# Patient Record
Sex: Male | Born: 1960 | Race: White | Hispanic: No | Marital: Married | Smoking: Never smoker
Health system: Southern US, Community
[De-identification: ages and names within clinical notes are randomized; demographics above are authoritative.]

## PROBLEM LIST (undated history)

## (undated) DIAGNOSIS — M67912 Unspecified disorder of synovium and tendon, left shoulder: Secondary | ICD-10-CM

## (undated) HISTORY — PX: TONSILLECTOMY: SUR1361

---

## 2017-02-12 ENCOUNTER — Encounter (HOSPITAL_COMMUNITY): Payer: Self-pay | Admitting: *Deleted

## 2017-02-12 ENCOUNTER — Emergency Department (HOSPITAL_COMMUNITY): Payer: Self-pay

## 2017-02-12 ENCOUNTER — Emergency Department (HOSPITAL_COMMUNITY)
Admission: EM | Admit: 2017-02-12 | Discharge: 2017-02-12 | Disposition: A | Discharge status: Home / Self Care | Payer: Self-pay | Attending: Emergency Medicine | Admitting: Emergency Medicine

## 2017-02-12 ENCOUNTER — Other Ambulatory Visit: Payer: Self-pay

## 2017-02-12 DIAGNOSIS — Y999 Unspecified external cause status: Secondary | ICD-10-CM | POA: Insufficient documentation

## 2017-02-12 DIAGNOSIS — Y9389 Activity, other specified: Secondary | ICD-10-CM | POA: Insufficient documentation

## 2017-02-12 DIAGNOSIS — S43015A Anterior dislocation of left humerus, initial encounter: Secondary | ICD-10-CM | POA: Insufficient documentation

## 2017-02-12 DIAGNOSIS — Y929 Unspecified place or not applicable: Secondary | ICD-10-CM | POA: Insufficient documentation

## 2017-02-12 DIAGNOSIS — M25562 Pain in left knee: Secondary | ICD-10-CM | POA: Insufficient documentation

## 2017-02-12 MED ORDER — KETAMINE HCL 10 MG/ML IJ SOLN
INTRAMUSCULAR | Status: DC | PRN
  Administered 2017-02-12: 60 mg via INTRAVENOUS

## 2017-02-12 MED ORDER — MORPHINE SULFATE (PF) 4 MG/ML IV SOLN
4.0000 mg | Freq: Once | INTRAVENOUS | Status: AC
  Administered 2017-02-12: 4 mg via INTRAVENOUS
  Filled 2017-02-12: qty 1

## 2017-02-12 MED ORDER — PROPOFOL 10 MG/ML IV BOLUS
40.0000 mg | Freq: Once | INTRAVENOUS | Status: DC
  Filled 2017-02-12: qty 20

## 2017-02-12 MED ORDER — PROPOFOL 10 MG/ML IV BOLUS
INTRAVENOUS | Status: DC | PRN
  Administered 2017-02-12: 40 mg via INTRAVENOUS

## 2017-02-12 MED ORDER — KETAMINE HCL-SODIUM CHLORIDE 100-0.9 MG/10ML-% IV SOSY
PREFILLED_SYRINGE | INTRAVENOUS | Status: AC
  Filled 2017-02-12: qty 10

## 2017-02-12 MED ORDER — ONDANSETRON 4 MG PO TBDP: 4.0000 mg | ORAL_TABLET | Freq: Three times a day (TID) | ORAL | 0 refills | Status: DC | PRN

## 2017-02-12 MED ORDER — OXYCODONE-ACETAMINOPHEN 5-325 MG PO TABS: 1.0000 | ORAL_TABLET | ORAL | 0 refills | Status: DC | PRN

## 2017-02-12 MED ORDER — KETAMINE HCL-SODIUM CHLORIDE 100-0.9 MG/10ML-% IV SOSY: 60.0000 mg | PREFILLED_SYRINGE | Freq: Once | INTRAVENOUS | Status: DC
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
## 2017-02-12 NOTE — ED Provider Notes (Signed)
Medical screening examination/treatment/procedure(s) were conducted as a shared visit with non-physician practitioner(s) and myself.  I personally evaluated the patient during the encounter.   EKG Interpretation None      56yM with L anterior shoulder dislocation. Reduced by Sharilyn SitesLisa Sanders. Please see her note for further details.  I was present for reduction and preformed sedation.    .Sedation Date/Time: 02/12/2017 7:15 AM Performed by: Raeford RazorKohut, Cindee Mclester, MD Authorized by: Raeford RazorKohut, Fiona Coto, MD   Consent:    Consent obtained:  Verbal   Consent given by:  Patient   Risks discussed:  Allergic reaction, dysrhythmia, inadequate sedation, nausea, prolonged hypoxia resulting in organ damage, prolonged sedation necessitating reversal, respiratory compromise necessitating ventilatory assistance and intubation and vomiting   Alternatives discussed:  Analgesia without sedation, anxiolysis and regional anesthesia Universal protocol:    Procedure explained and questions answered to patient or proxy's satisfaction: yes     Relevant documents present and verified: yes     Test results available and properly labeled: yes     Imaging studies available: yes     Required blood products, implants, devices, and special equipment available: yes     Site/side marked: yes     Immediately prior to procedure a time out was called: yes     Patient identity confirmation method:  Verbally with patient Indications:    Procedure performed:  Dislocation reduction   Procedure necessitating sedation performed by:  Physician performing sedation   Intended level of sedation:  Deep Pre-sedation assessment:    Time since last food or drink:  .   NPO status caution: urgency dictates proceeding with non-ideal NPO status     ASA classification: class 2 - patient with mild systemic disease     Neck mobility: normal     Mouth opening:  2 finger widths   Thyromental distance:  3 finger widths   Mallampati score:  II - soft  palate, uvula, fauces visible   Pre-sedation assessments completed and reviewed: airway patency, cardiovascular function, hydration status, mental status, nausea/vomiting, pain level, respiratory function and temperature     Pre-sedation assessment completed:  02/12/2017 7:10 AM Immediate pre-procedure details:    Reassessment: Patient reassessed immediately prior to procedure     Reviewed: vital signs, relevant labs/tests and NPO status     Verified: bag valve mask available, emergency equipment available, intubation equipment available, IV patency confirmed, oxygen available and suction available   Procedure details (see MAR for exact dosages):    Preoxygenation:  Nasal cannula   Sedation:  Propofol and ketamine   Intra-procedure monitoring:  Blood pressure monitoring, cardiac monitor, continuous pulse oximetry, frequent LOC assessments, frequent vital sign checks and continuous capnometry   Intra-procedure events: none     Total Provider sedation time (minutes):  35 Post-procedure details:    Post-sedation assessment completed:  02/12/2017 7:54 AM   Attendance: Constant attendance by certified staff until patient recovered     Recovery: Patient returned to pre-procedure baseline     Post-sedation assessments completed and reviewed: airway patency, cardiovascular function, hydration status, mental status, nausea/vomiting, pain level and respiratory function     Patient is stable for discharge or admission: yes     Patient tolerance:  Tolerated well, no immediate complications     Raeford RazorKohut, Dacen Frayre, MD 02/12/17 [phone]

## 2017-02-12 NOTE — ED Notes (Signed)
Correction lt knee pain

## 2017-02-12 NOTE — ED Notes (Signed)
Portable XR at bedside

## 2017-02-12 NOTE — ED Notes (Signed)
Set up for conscious sedation ready at bedside, awaiting MD to be available for sedation.
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
## 2017-02-12 NOTE — Discharge Instructions (Signed)
Take the prescribed medication as directed.  Can use motrin/ibuprofen in between doses if needed. Keep arm in sling for now, do not lift arm above your head. Follow-up with orthopedics-- call Monday morning to schedule appt. Return to the ED for new or worsening symptoms.

## 2017-03-11 ENCOUNTER — Other Ambulatory Visit: Payer: Self-pay | Admitting: Orthopaedic Surgery

## 2017-03-11 DIAGNOSIS — M25512 Pain in left shoulder: Secondary | ICD-10-CM

## 2017-03-19 ENCOUNTER — Ambulatory Visit
Admission: RE | Admit: 2017-03-19 | Discharge: 2017-03-19 | Disposition: A | Discharge status: Home / Self Care | Payer: Self-pay | Source: Ambulatory Visit | Attending: Orthopaedic Surgery | Admitting: Orthopaedic Surgery

## 2017-03-19 DIAGNOSIS — M25512 Pain in left shoulder: Secondary | ICD-10-CM

## 2017-04-08 ENCOUNTER — Other Ambulatory Visit: Payer: Self-pay

## 2017-04-08 ENCOUNTER — Encounter (HOSPITAL_BASED_OUTPATIENT_CLINIC_OR_DEPARTMENT_OTHER): Payer: Self-pay | Admitting: *Deleted

## 2017-04-10 NOTE — H&P (Signed)
PREOPERATIVE H&P  Chief Complaint: LEFT SHOULDER CARTILEDGE DISORDER ROTATOR CUFF TEAR BURSITIS AND TENDONITIS  HPI: Alex Jones is a 57 y.o. male who presents for preoperative history and physical with a diagnosis of LEFT SHOULDER CARTILEDGE DISORDER ROTATOR CUFF TEAR BURSITIS AND TENDONITIS. Symptoms are rated as moderate to severe, and have been worsening.  This is significantly impairing activities of daily living.  He has elected for surgical management.   Past Medical History:  Diagnosis Date  . Rotator cuff disorder, left    Past Surgical History:  Procedure Laterality Date  . TONSILLECTOMY     at age 89   Social History   Socioeconomic History  . Marital status: Married    Spouse name: None  . Number of children: None  . Years of education: None  . Highest education level: None  Social Needs  . Financial resource strain: None  . Food insecurity - worry: None  . Food insecurity - inability: None  . Transportation needs - medical: None  . Transportation needs - non-medical: None  Occupational History  . None  Tobacco Use  . Smoking status: Never Smoker  . Smokeless tobacco: Current User    Types: Snuff  Substance and Sexual Activity  . Alcohol use: Yes    Comment: social  . Drug use: No  . Sexual activity: None  Other Topics Concern  . None  Social History Narrative  . None   History reviewed. No pertinent family history. Allergies  Allergen Reactions  . Oxycodone Nausea And Vomiting   Prior to Admission medications   Medication Sig Start Date End Date Taking? Authorizing Provider  oxyCODONE-acetaminophen (PERCOCET) 5-325 MG tablet Take 1 tablet by mouth every 4 (four) hours as needed. 02/12/17  Yes Garlon HatchetSanders, Lisa M, PA-C     Positive ROS: All other systems have been reviewed and were otherwise negative with the exception of those mentioned in the HPI and as above.  Physical Exam: General: Alert, no acute distress Cardiovascular: No pedal  edema Respiratory: No cyanosis, no use of accessory musculature GI: No organomegaly, abdomen is soft and non-tender Skin: No lesions in the area of chief complaint Neurologic: Sensation intact distally Psychiatric: Patient is competent for consent with normal mood and affect Lymphatic: No axillary or cervical lymphadenopathy  MUSCULOSKELETAL: L shoulder pain with ROM, 4/5 cuff, NVID  Assessment: LEFT SHOULDER CARTILEDGE DISORDER ROTATOR CUFF TEAR BURSITIS AND TENDONITIS  Plan: Plan for Procedure(s): LEFT SHOULDER ARTHROSCOPY WITH SUBACROMIAL DECOMPRESSION AND ROTATOR CUFF  BICEP TENDON REPAIR  The risks benefits and alternatives were discussed with the patient including but not limited to the risks of nonoperative treatment, versus surgical intervention including infection, bleeding, nerve injury,  blood clots, cardiopulmonary complications, morbidity, mortality, among others, and they were willing to proceed.   Bjorn Pippinax T Nohea Kras, MD  04/10/2017 7:57 PM

## 2017-04-11 ENCOUNTER — Ambulatory Visit (HOSPITAL_BASED_OUTPATIENT_CLINIC_OR_DEPARTMENT_OTHER): Payer: Worker's Compensation | Admitting: Anesthesiology

## 2017-04-11 ENCOUNTER — Encounter (HOSPITAL_BASED_OUTPATIENT_CLINIC_OR_DEPARTMENT_OTHER): Payer: Self-pay | Admitting: *Deleted

## 2017-04-11 ENCOUNTER — Ambulatory Visit (HOSPITAL_BASED_OUTPATIENT_CLINIC_OR_DEPARTMENT_OTHER)
Admission: RE | Admit: 2017-04-11 | Discharge: 2017-04-11 | Disposition: A | Discharge status: Home / Self Care | Payer: Worker's Compensation | Source: Ambulatory Visit | Attending: Orthopaedic Surgery | Admitting: Orthopaedic Surgery

## 2017-04-11 ENCOUNTER — Encounter (HOSPITAL_BASED_OUTPATIENT_CLINIC_OR_DEPARTMENT_OTHER)
Admission: RE | Disposition: A | Discharge status: Home / Self Care | Payer: Self-pay | Source: Ambulatory Visit | Attending: Orthopaedic Surgery

## 2017-04-11 ENCOUNTER — Other Ambulatory Visit: Payer: Self-pay

## 2017-04-11 DIAGNOSIS — M24012 Loose body in left shoulder: Secondary | ICD-10-CM | POA: Diagnosis not present

## 2017-04-11 DIAGNOSIS — M7552 Bursitis of left shoulder: Secondary | ICD-10-CM | POA: Insufficient documentation

## 2017-04-11 DIAGNOSIS — X58XXXA Exposure to other specified factors, initial encounter: Secondary | ICD-10-CM | POA: Diagnosis not present

## 2017-04-11 DIAGNOSIS — Z885 Allergy status to narcotic agent status: Secondary | ICD-10-CM | POA: Diagnosis not present

## 2017-04-11 DIAGNOSIS — S43432A Superior glenoid labrum lesion of left shoulder, initial encounter: Secondary | ICD-10-CM | POA: Insufficient documentation

## 2017-04-11 DIAGNOSIS — F17220 Nicotine dependence, chewing tobacco, uncomplicated: Secondary | ICD-10-CM | POA: Diagnosis not present

## 2017-04-11 DIAGNOSIS — M75102 Unspecified rotator cuff tear or rupture of left shoulder, not specified as traumatic: Secondary | ICD-10-CM | POA: Diagnosis not present

## 2017-04-11 HISTORY — PX: SHOULDER ARTHROSCOPY WITH ROTATOR CUFF REPAIR AND SUBACROMIAL DECOMPRESSION: SHX5686

## 2017-04-11 HISTORY — PX: SHOULDER ARTHROSCOPY WITH BICEPS TENDON REPAIR: SHX5674

## 2017-04-11 HISTORY — DX: Unspecified disorder of synovium and tendon, left shoulder: M67.912

## 2017-04-11 SURGERY — SHOULDER ARTHROSCOPY WITH ROTATOR CUFF REPAIR AND SUBACROMIAL DECOMPRESSION
Anesthesia: General | Site: Shoulder | Laterality: Left

## 2017-04-11 MED ORDER — ONDANSETRON HCL 4 MG/2ML IJ SOLN
INTRAMUSCULAR | Status: AC
  Filled 2017-04-11: qty 2

## 2017-04-11 MED ORDER — MIDAZOLAM HCL 2 MG/2ML IJ SOLN
1.0000 mg | INTRAMUSCULAR | Status: DC | PRN
  Administered 2017-04-11: 2 mg via INTRAVENOUS

## 2017-04-11 MED ORDER — DEXTROSE 5 % IV SOLN
3.0000 g | INTRAVENOUS | Status: AC
  Administered 2017-04-11: 3 g via INTRAVENOUS

## 2017-04-11 MED ORDER — FENTANYL CITRATE (PF) 100 MCG/2ML IJ SOLN
50.0000 ug | INTRAMUSCULAR | Status: DC | PRN
  Administered 2017-04-11: 100 ug via INTRAVENOUS
  Administered 2017-04-11: 25 ug via INTRAVENOUS

## 2017-04-11 MED ORDER — DEXAMETHASONE SODIUM PHOSPHATE 4 MG/ML IJ SOLN
INTRAMUSCULAR | Status: DC | PRN
  Administered 2017-04-11: 10 mg via INTRAVENOUS

## 2017-04-11 MED ORDER — SCOPOLAMINE 1 MG/3DAYS TD PT72: 1.0000 | MEDICATED_PATCH | Freq: Once | TRANSDERMAL | Status: DC | PRN

## 2017-04-11 MED ORDER — ONDANSETRON HCL 4 MG PO TABS: 4.0000 mg | ORAL_TABLET | Freq: Three times a day (TID) | ORAL | 1 refills | Status: AC | PRN

## 2017-04-11 MED ORDER — OXYCODONE HCL 5 MG PO TABS: ORAL_TABLET | ORAL | 0 refills | Status: AC

## 2017-04-11 MED ORDER — LIDOCAINE 2% (20 MG/ML) 5 ML SYRINGE
INTRAMUSCULAR | Status: DC | PRN
  Administered 2017-04-11: 100 mg via INTRAVENOUS

## 2017-04-11 MED ORDER — LACTATED RINGERS IV SOLN
INTRAVENOUS | Status: DC
  Administered 2017-04-11: 08:00:00 via INTRAVENOUS

## 2017-04-11 MED ORDER — ROPIVACAINE HCL 7.5 MG/ML IJ SOLN
INTRAMUSCULAR | Status: DC | PRN
  Administered 2017-04-11: 30 mL via PERINEURAL

## 2017-04-11 MED ORDER — PROPOFOL 10 MG/ML IV BOLUS
INTRAVENOUS | Status: AC
  Filled 2017-04-11: qty 20

## 2017-04-11 MED ORDER — FENTANYL CITRATE (PF) 100 MCG/2ML IJ SOLN
INTRAMUSCULAR | Status: AC
  Filled 2017-04-11: qty 2

## 2017-04-11 MED ORDER — EPINEPHRINE 30 MG/30ML IJ SOLN
INTRAMUSCULAR | Status: AC
  Filled 2017-04-11: qty 1

## 2017-04-11 MED ORDER — CEFAZOLIN SODIUM-DEXTROSE 2-4 GM/100ML-% IV SOLN
INTRAVENOUS | Status: AC
  Filled 2017-04-11: qty 200

## 2017-04-11 MED ORDER — PROPOFOL 10 MG/ML IV BOLUS
INTRAVENOUS | Status: DC | PRN
  Administered 2017-04-11: 50 mg via INTRAVENOUS
  Administered 2017-04-11: 200 mg via INTRAVENOUS

## 2017-04-11 MED ORDER — SODIUM CHLORIDE 0.9 % IR SOLN
Status: DC | PRN
  Administered 2017-04-11: 6000 mL

## 2017-04-11 MED ORDER — DEXAMETHASONE SODIUM PHOSPHATE 10 MG/ML IJ SOLN
INTRAMUSCULAR | Status: AC
  Filled 2017-04-11: qty 1

## 2017-04-11 MED ORDER — MEPERIDINE HCL 25 MG/ML IJ SOLN: 6.2500 mg | INTRAMUSCULAR | Status: DC | PRN

## 2017-04-11 MED ORDER — LIDOCAINE HCL (CARDIAC) 20 MG/ML IV SOLN
INTRAVENOUS | Status: AC
  Filled 2017-04-11: qty 5

## 2017-04-11 MED ORDER — ACETAMINOPHEN 500 MG PO TABS: 1000.0000 mg | ORAL_TABLET | Freq: Three times a day (TID) | ORAL | 0 refills | Status: AC

## 2017-04-11 MED ORDER — HYDROMORPHONE HCL 1 MG/ML IJ SOLN: 0.2500 mg | INTRAMUSCULAR | Status: DC | PRN

## 2017-04-11 MED ORDER — MELOXICAM 7.5 MG PO TABS: 7.5000 mg | ORAL_TABLET | Freq: Every day | ORAL | 2 refills | Status: AC

## 2017-04-11 MED ORDER — CLONIDINE HCL (ANALGESIA) 100 MCG/ML EP SOLN
EPIDURAL | Status: DC | PRN
  Administered 2017-04-11: 75 ug

## 2017-04-11 MED ORDER — MIDAZOLAM HCL 2 MG/2ML IJ SOLN
INTRAMUSCULAR | Status: AC
  Filled 2017-04-11: qty 2

## 2017-04-11 MED ORDER — SUCCINYLCHOLINE CHLORIDE 20 MG/ML IJ SOLN
INTRAMUSCULAR | Status: DC | PRN
  Administered 2017-04-11: 60 mg via INTRAVENOUS

## 2017-04-11 MED ORDER — PROMETHAZINE HCL 25 MG/ML IJ SOLN: 6.2500 mg | INTRAMUSCULAR | Status: DC | PRN

## 2017-04-11 MED ORDER — ONDANSETRON HCL 4 MG/2ML IJ SOLN
INTRAMUSCULAR | Status: DC | PRN
  Administered 2017-04-11: 4 mg via INTRAVENOUS

## 2017-04-11 MED ORDER — CHLORHEXIDINE GLUCONATE 4 % EX LIQD: 60.0000 mL | Freq: Once | CUTANEOUS | Status: DC

## 2017-04-11 SURGICAL SUPPLY — 76 items
ANCHOR PEEK 4.75X19.1 SWLK C (Anchor) ×9 IMPLANT
ANCHOR SUT FBRTK 2 TIGTAIL (Anchor) ×6 IMPLANT
BENZOIN TINCTURE PRP APPL 2/3 (GAUZE/BANDAGES/DRESSINGS) ×3 IMPLANT
BLADE EXCALIBUR 4.0MM X 13CM (MISCELLANEOUS) ×1
BLADE EXCALIBUR 4.0X13 (MISCELLANEOUS) ×2 IMPLANT
BLADE SHAVER BONE 5.0MM X 13CM (MISCELLANEOUS)
BLADE SHAVER BONE 5.0X13 (MISCELLANEOUS) IMPLANT
BNDG COHESIVE 4X5 TAN STRL (GAUZE/BANDAGES/DRESSINGS) IMPLANT
BURR OVAL 8 FLU 4.0MM X 13CM (MISCELLANEOUS)
BURR OVAL 8 FLU 4.0X13 (MISCELLANEOUS) IMPLANT
CANNULA 5.75X71 LONG (CANNULA) IMPLANT
CANNULA PASSPORT BUTTON 10-40 (CANNULA) ×9 IMPLANT
CANNULA TWIST IN 8.25X7CM (CANNULA) IMPLANT
CHLORAPREP W/TINT 26ML (MISCELLANEOUS) ×3 IMPLANT
CLOSURE WOUND 1/2 X4 (GAUZE/BANDAGES/DRESSINGS) ×1
DECANTER SPIKE VIAL GLASS SM (MISCELLANEOUS) IMPLANT
DISSECTOR 3.5MM X 13CM CVD (MISCELLANEOUS) IMPLANT
DISSECTOR 4.0MMX13CM CVD (MISCELLANEOUS) IMPLANT
DRAPE IMP U-DRAPE 54X76 (DRAPES) ×3 IMPLANT
DRAPE INCISE IOBAN 66X45 STRL (DRAPES) ×3 IMPLANT
DRAPE STERI 35X30 U-POUCH (DRAPES) ×3 IMPLANT
DRAPE U-SHAPE 76X120 STRL (DRAPES) ×3 IMPLANT
DRSG PAD ABDOMINAL 8X10 ST (GAUZE/BANDAGES/DRESSINGS) ×3 IMPLANT
ELECT NEEDLE TIP 2.8 STRL (NEEDLE) IMPLANT
ELECT REM PT RETURN 9FT ADLT (ELECTROSURGICAL) ×3
ELECTRODE REM PT RTRN 9FT ADLT (ELECTROSURGICAL) ×1 IMPLANT
GAUZE SPONGE 4X4 12PLY STRL (GAUZE/BANDAGES/DRESSINGS) ×3 IMPLANT
GAUZE XEROFORM 1X8 LF (GAUZE/BANDAGES/DRESSINGS) IMPLANT
GLOVE BIO SURGEON STRL SZ8 (GLOVE) ×3 IMPLANT
GLOVE BIOGEL PI IND STRL 7.0 (GLOVE) ×1 IMPLANT
GLOVE BIOGEL PI IND STRL 8 (GLOVE) ×2 IMPLANT
GLOVE BIOGEL PI INDICATOR 7.0 (GLOVE) ×2
GLOVE BIOGEL PI INDICATOR 8 (GLOVE) ×4
GLOVE ECLIPSE 6.5 STRL STRAW (GLOVE) ×3 IMPLANT
GLOVE ECLIPSE 8.0 STRL XLNG CF (GLOVE) ×3 IMPLANT
GOWN STRL REUS W/ TWL LRG LVL3 (GOWN DISPOSABLE) ×1 IMPLANT
GOWN STRL REUS W/TWL LRG LVL3 (GOWN DISPOSABLE) ×2
GOWN STRL REUS W/TWL XL LVL3 (GOWN DISPOSABLE) ×3 IMPLANT
KIT SPEAR STR 1.6MM DRILL (MISCELLANEOUS) ×3 IMPLANT
KIT STABILIZATION SHOULDER (MISCELLANEOUS) ×3 IMPLANT
LASSO 90 CVE QUICKPAS (DISPOSABLE) IMPLANT
MANIFOLD NEPTUNE II (INSTRUMENTS) ×3 IMPLANT
NDL SUT 6 .5 CRC .975X.05 MAYO (NEEDLE) IMPLANT
NEEDLE MAYO TAPER (NEEDLE)
NEEDLE SCORPION MULTI FIRE (NEEDLE) ×6 IMPLANT
PACK ARTHROSCOPY DSU (CUSTOM PROCEDURE TRAY) ×3 IMPLANT
PACK BASIN DAY SURGERY FS (CUSTOM PROCEDURE TRAY) ×3 IMPLANT
PENCIL BUTTON HOLSTER BLD 10FT (ELECTRODE) IMPLANT
PROBE BIPOLAR ATHRO 135MM 90D (MISCELLANEOUS) ×3 IMPLANT
RESTRAINT HEAD UNIVERSAL NS (MISCELLANEOUS) ×3 IMPLANT
SHEET MEDIUM DRAPE 40X70 STRL (DRAPES) ×3 IMPLANT
SLEEVE SCD COMPRESS KNEE MED (MISCELLANEOUS) ×3 IMPLANT
SLING ARM FOAM STRAP LRG (SOFTGOODS) IMPLANT
SLING ARM IMMOBILIZER LRG (SOFTGOODS) IMPLANT
SLING ARM IMMOBILIZER MED (SOFTGOODS) IMPLANT
SLING ARM IMMOBILIZER XL (CAST SUPPLIES) ×3 IMPLANT
SLING ARM MED ADULT FOAM STRAP (SOFTGOODS) IMPLANT
SLING ARM XL FOAM STRAP (SOFTGOODS) IMPLANT
SPONGE LAP 4X18 X RAY DECT (DISPOSABLE) ×3 IMPLANT
STRIP CLOSURE SKIN 1/2X4 (GAUZE/BANDAGES/DRESSINGS) ×2 IMPLANT
SUCTION FRAZIER HANDLE 10FR (MISCELLANEOUS)
SUCTION TUBE FRAZIER 10FR DISP (MISCELLANEOUS) IMPLANT
SUT ETHILON 3 0 PS 1 (SUTURE) IMPLANT
SUT FIBERWIRE #2 38 T-5 BLUE (SUTURE)
SUT MNCRL AB 4-0 PS2 18 (SUTURE) ×3 IMPLANT
SUT TIGER TAPE 7 IN WHITE (SUTURE) IMPLANT
SUTURE FIBERWR #2 38 T-5 BLUE (SUTURE) IMPLANT
SUTURE TAPE TIGERLINK 1.3MM BL (SUTURE) IMPLANT
SUTURETAPE TIGERLINK 1.3MM BL (SUTURE)
TAPE FIBER 2MM 7IN #2 BLUE (SUTURE) IMPLANT
TOWEL OR 17X24 6PK STRL BLUE (TOWEL DISPOSABLE) ×3 IMPLANT
TOWEL OR NON WOVEN STRL DISP B (DISPOSABLE) ×3 IMPLANT
TUBE CONNECTING 20'X1/4 (TUBING) ×1
TUBE CONNECTING 20X1/4 (TUBING) ×2 IMPLANT
TUBING ARTHROSCOPY IRRIG 16FT (MISCELLANEOUS) ×3 IMPLANT
YANKAUER SUCT BULB TIP NO VENT (SUCTIONS) IMPLANT

## 2017-04-11 NOTE — Anesthesia Preprocedure Evaluation (Signed)
Anesthesia Evaluation  Patient identified by MRN, date of birth, ID band Patient awake    Reviewed: Allergy & Precautions, NPO status , Patient's Chart, lab work & pertinent test results  Airway Mallampati: II  TM Distance: >3 FB Neck ROM: Full    Dental no notable dental hx.    Pulmonary neg pulmonary ROS,    Pulmonary exam normal breath sounds clear to auscultation       Cardiovascular negative cardio ROS Normal cardiovascular exam Rhythm:Regular Rate:Normal     Neuro/Psych negative neurological ROS  negative psych ROS   GI/Hepatic negative GI ROS, Neg liver ROS,   Endo/Other  negative endocrine ROS  Renal/GU negative Renal ROS  negative genitourinary   Musculoskeletal negative musculoskeletal ROS (+)   Abdominal   Peds negative pediatric ROS (+)  Hematology negative hematology ROS (+)   Anesthesia Other Findings   Reproductive/Obstetrics negative OB ROS                             Anesthesia Physical Anesthesia Plan  ASA: II  Anesthesia Plan: General   Post-op Pain Management: GA combined w/ Regional for post-op pain   Induction: Intravenous  PONV Risk Score and Plan: 2 and Ondansetron and Dexamethasone  Airway Management Planned: Oral ETT  Additional Equipment:   Intra-op Plan:   Post-operative Plan: Extubation in OR  Informed Consent: I have reviewed the patients History and Physical, chart, labs and discussed the procedure including the risks, benefits and alternatives for the proposed anesthesia with the patient or authorized representative who has indicated his/her understanding and acceptance.   Dental advisory given  Plan Discussed with: CRNA  Anesthesia Plan Comments:         Anesthesia Quick Evaluation

## 2017-04-11 NOTE — Discharge Instructions (Signed)

## 2017-04-11 NOTE — Transfer of Care (Signed)
Immediate Anesthesia Transfer of Care Note  Patient: Alex Jones  Procedure(s) Performed: LEFT SHOULDER ARTHROSCOPY WITH SUBACROMIAL DECOMPRESSION AND ROTATOR CUFF  BICEP TENDON REPAIR (Left Shoulder)  Patient Location: PACU  Anesthesia Type:General  Level of Consciousness: awake, alert  and oriented  Airway & Oxygen Therapy: Patient Spontanous Breathing and Patient connected to face mask oxygen  Post-op Assessment: Report given to RN and Post -op Vital signs reviewed and stable  Post vital signs: Reviewed and stable  Last Vitals:  Vitals:   04/11/17 0830 04/11/17 1155  BP: 114/68   Pulse: 63 89  Resp: 16 (!) 22  Temp:    SpO2: 99% 96%    Last Pain:  Vitals:   04/11/17 0734  TempSrc: Oral         Complications: No apparent anesthesia complications

## 2017-04-11 NOTE — Anesthesia Procedure Notes (Signed)
Procedure Name: Intubation Date/Time: 04/11/2017 9:49 AM Performed by: Maryella Shivers, CRNA Pre-anesthesia Checklist: Patient identified, Emergency Drugs available, Suction available and Patient being monitored Patient Re-evaluated:Patient Re-evaluated prior to induction Oxygen Delivery Method: Circle system utilized Preoxygenation: Pre-oxygenation with 100% oxygen Induction Type: IV induction Ventilation: Mask ventilation without difficulty Laryngoscope Size: Mac and 4 Grade View: Grade III Tube size: 8.0 mm Number of attempts: 1 Airway Equipment and Method: Stylet and Oral airway Placement Confirmation: ETT inserted through vocal cords under direct vision,  positive ETCO2 and breath sounds checked- equal and bilateral Secured at: 22 cm Tube secured with: Tape Dental Injury: Teeth and Oropharynx as per pre-operative assessment

## 2017-04-11 NOTE — Interval H&P Note (Signed)
Discussed case, risks and benefits with patient again.  All questions answered, no change to history.  Betrice Wanat MD  

## 2017-04-11 NOTE — Op Note (Signed)
Orthopaedic Surgery Operative Note (CSN: [id number])  Alex Jones Wynter  1960/04/07 Date of Surgery: 04/11/2017   Diagnoses:  LEFT SHOULDER CARTILEDGE DISORDER ROTATOR CUFF TEAR BURSITIS AND TENDONITIS  Procedure: Left shoulder arthroscopy with extensive debridement [id number] Loose body removal 29819 Rotator cuff repair [id number] Biceps tenodesis [id number] Acromioplasty [id number]   Operative Finding Successful completion of planned procedure.  Patient had a separation of his labrum from the anterior glenoid but he was relatively tight and not unstable thus we opted not to fix this as it may have overtighten the shoulder and prevent him from getting his motion.  He had a significant amount of capsulitis as well.  Exam under anesthesia: Active forward elevation about 150 with some tightness, external rotation about 30, joint not unstable anterior posterior. Articular space: 2 cartilaginous loose bodies which were removed, capsule intact, separation of the labrum from the anterior glenoid though the joint was significantly tight and not unstable.  This was left as is. Chondral surfaces: Grade 2 changes to the glenoid centrally and a Hill-Sachs lesion noted. Biceps: Unstable type II SLAP with injection of the tendon itself Subscapularis: Intact  Superior Cuff: small full-thickness supraspinatus tear with accompanying Articular and bursal sided where Bursal side: Significant wear on cuff and impingement signs on acromion a type II acromion converted to type I   Post-operative plan: The patient will be NWB in sling.  The patient will be dc home.  DVT prophylaxis not indicated in isolated upper extremity surgery patient with no specific risks factors.  Pain control with PRN pain medication preferring oral medicines.  Follow up plan will be scheduled in approximately 7 days for incision check and XR Outlet.  Post-Op Diagnosis: Same Surgeons:Primary: Bjorn PippinVarkey, Allyssia Skluzacek T, MD Assistants:None Location: [facility] OR ROOM  5 Anesthesia: General Antibiotics: Ancef 2g preop,  Tourniquet time: * No tourniquets in log * Estimated Blood Loss: minimal Complications: None Specimens: None Implants: * No implants in log *  Indications for Surgery:   Alex Jones Prange is a 57 y.o. male with work-related shoulder dislocation and continued pain and weakness.  In this middle-aged male his incidence of rotator cuff tear with a traumatic dislocation is high thus we obtain an MRI after failing nonoperative measures in the setting of a demonstrated traumatic small full-thickness rotator cuff tear we recommend surgical management.  Benefits and risks of operative and nonoperative management were discussed prior to surgery with patient/guardian(s) and informed consent form was completed.  Specific risks including infection, need for additional surgery, stiffness, continued instability, continued pain, re-tear.   Procedure:   The patient was identified in the preoperative holding area where the surgical site was marked. The patient was taken to the OR where a procedural timeout was called and the above noted anesthesia was induced.  The patient was positioned beachchair on [employer] table.  Preoperative antibiotics were dosed.  The patient's left shoulder was prepped and draped in the usual sterile fashion.  A second preoperative timeout was called.      Patient was correctly identified in the preoperative holding area and operative site marked.  Patient brought to OR and positioned beachchair on an [place] table ensuring that all bony prominences were padded and the head was in an appropriate location.  Anesthesia was induced and the operative shoulder was prepped and draped in the usual sterile fashion.  Timeout was called preincision.  A standard posterior viewing portal was made after localizing the portal with a spinal needle.  An anterior accessory portal was  also made.  After clearing the articular space the camera was positioned in the  subacromial space.  Findings above.  We used shaver as well as RF ablator to perform a synovectomy as the patient had significant synovitis in the anterior interval.  The interval was minorly released that we took care not to release the [facility] as the patient had a history of instability.  We did not want to create an unstable shoulder and in the setting of his labral injury anteriorly we thought that we should leave things as is as he was not unstable at the beginning of the case under EUA.  We used a shaver device to remove multiple, 3-4, small cartilaginous loose bodies likely associated with a Hill-Sachs lesion.  Subacromial decompression: We made a lateral portal with spinal needle guidance. We then proceeded to debride bursal tissue extensively with a shaver and arthrocare device. At that point we continued to identify the borders of the acromion and identify the spur. We then carefully preserved the deltoid fascia and used a burr to convert the type 2 acromion to a Type 1 flat acromion without issue.  Arthroscopic Rotator Cuff Repair: Tuberosity was prepared with a burr to a bleeding bed.  Following completion of the above we placed 1 4.7 Swivelock anchor loaded with a tape at inserted at the medial articular margin and an scorpion suture passing device, shuttled  utures medially in a horizontal mattress suture configuration.  We then tied using arthroscopic knot tying techniques  each suture to its partner reducing the tendon at the prepared insertion site.  The fiber tape was not tied. With a medial row suture limbs then incorporated, 2 anteriorly and 2 posteriorly, into each of two 4.75 PEEK SwiveLock anchors, each placed 8 to 10 mm below the tip of the tuberosity and spanning anterior-posterior width of the tear with care to avoid over tensioning.   Biceps tenodesis: We marked the tendon and then performed a tenotomy and debridement of the stump in the articular space. We then identified the  biceps tendon in its groove suprapec with the arthroscope in the lateral portal taking care to move from lateral to medial to avoid injury to the subscapularis. At that point we unroofed the tendon itself and mobilized it. An accessory anterior portal was made in line with the tendon and we grasped it from the anterior superior portal and worked from the accessory anterior portal. Two Fibertak 1.48mm anchors were placed in the groove and the tendon was secured in a luggage loop style fashion with good tension on the tendon.  The incisions were closed with absorbable monocryl, benzoin and steri strips.  A sterile dressing was placed along with a sling. The patient was awoken from general anesthesia and taken to the PACU in stable condition without complication.

## 2017-04-11 NOTE — Anesthesia Procedure Notes (Signed)
Anesthesia Regional Block: Interscalene brachial plexus block   Pre-Anesthetic Checklist: ,, timeout performed, Correct Patient, Correct Site, Correct Laterality, Correct Procedure, Correct Position, site marked, Risks and benefits discussed,  Surgical consent,  Pre-op evaluation,  At surgeon's request and post-op pain management  Laterality: Left  Prep: chloraprep       Needles:  Injection technique: Single-shot  Needle Type: Stimulator Needle - 40     Needle Length: 4cm  Needle Gauge: 22     Additional Needles:   Procedures:,,,, ultrasound used (permanent image in chart),,,,  Narrative:  Start time: 04/11/2017 8:27 AM End time: 04/11/2017 8:30 AM Injection made incrementally with aspirations every 5 mL. Anesthesiologist: Lewie LoronGermeroth, Jillian Pianka, MD  Additional Notes: BP cuff, EKG monitors applied. Sedation begun. Nerve location verified with U/S. Anesthetic injected incrementally, slowly , and after neg aspirations under direct u/s guidance. Good perineural spread. Tolerated well.

## 2017-04-11 NOTE — Anesthesia Postprocedure Evaluation (Signed)
Anesthesia Post Note  Patient: Alex Jones  Procedure(s) Performed: LEFT SHOULDER ARTHROSCOPY WITH SUBACROMIAL DECOMPRESSION AND ROTATOR CUFF  BICEP TENDON REPAIR (Left Shoulder)     Patient location during evaluation: PACU Anesthesia Type: General Level of consciousness: sedated and patient cooperative Pain management: pain level controlled Vital Signs Assessment: post-procedure vital signs reviewed and stable Respiratory status: spontaneous breathing Cardiovascular status: stable Anesthetic complications: no    Last Vitals:  Vitals:   04/11/17 1215 04/11/17 1257  BP: 121/77 111/86  Pulse: 89 78  Resp: 16 18  Temp:  36.4 C  SpO2: 93% 95%    Last Pain:  Vitals:   04/11/17 1257  TempSrc: Oral  PainSc: 0-No pain                 Lewie LoronJohn Kiana Hollar

## 2017-04-11 NOTE — Progress Notes (Signed)
Assisted Dr. Germeroth with left, ultrasound guided, supraclavicular block. Side rails up, monitors on throughout procedure. See vital signs in flow sheet. Tolerated Procedure well. 

## 2017-04-12 ENCOUNTER — Encounter (HOSPITAL_BASED_OUTPATIENT_CLINIC_OR_DEPARTMENT_OTHER): Payer: Self-pay | Admitting: Orthopaedic Surgery

## 2017-04-13 ENCOUNTER — Encounter (HOSPITAL_BASED_OUTPATIENT_CLINIC_OR_DEPARTMENT_OTHER): Payer: Self-pay | Admitting: Orthopaedic Surgery

## 2019-10-30 IMAGING — DX DG SHOULDER 2+V*L*
2 series · 2 of 2 positions shown · non-contrast
Comparison: None.

CLINICAL DATA: 56-year-old male with left shoulder deformity.

EXAM:
LEFT SHOULDER - 2+ VIEW

[shoulder y view]
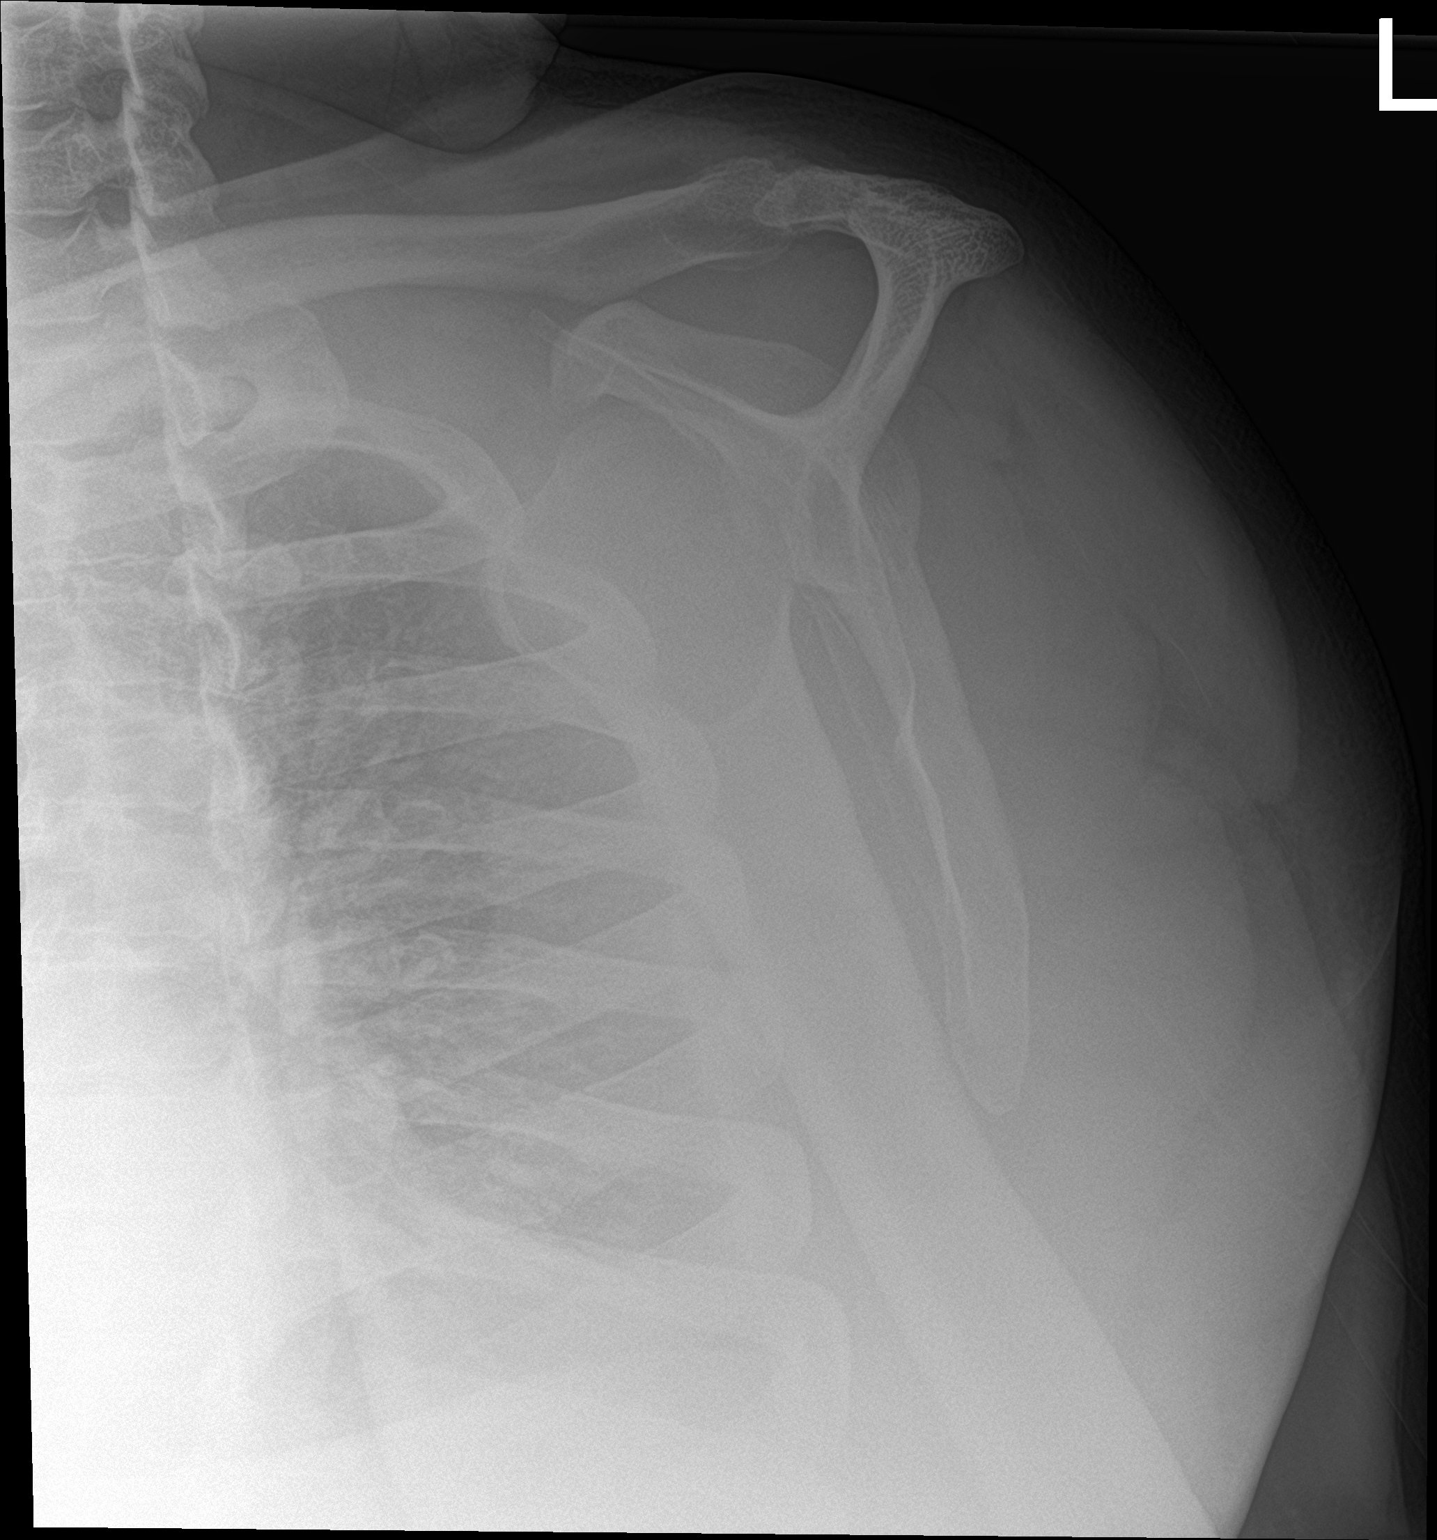

[shoulder ap neutral]
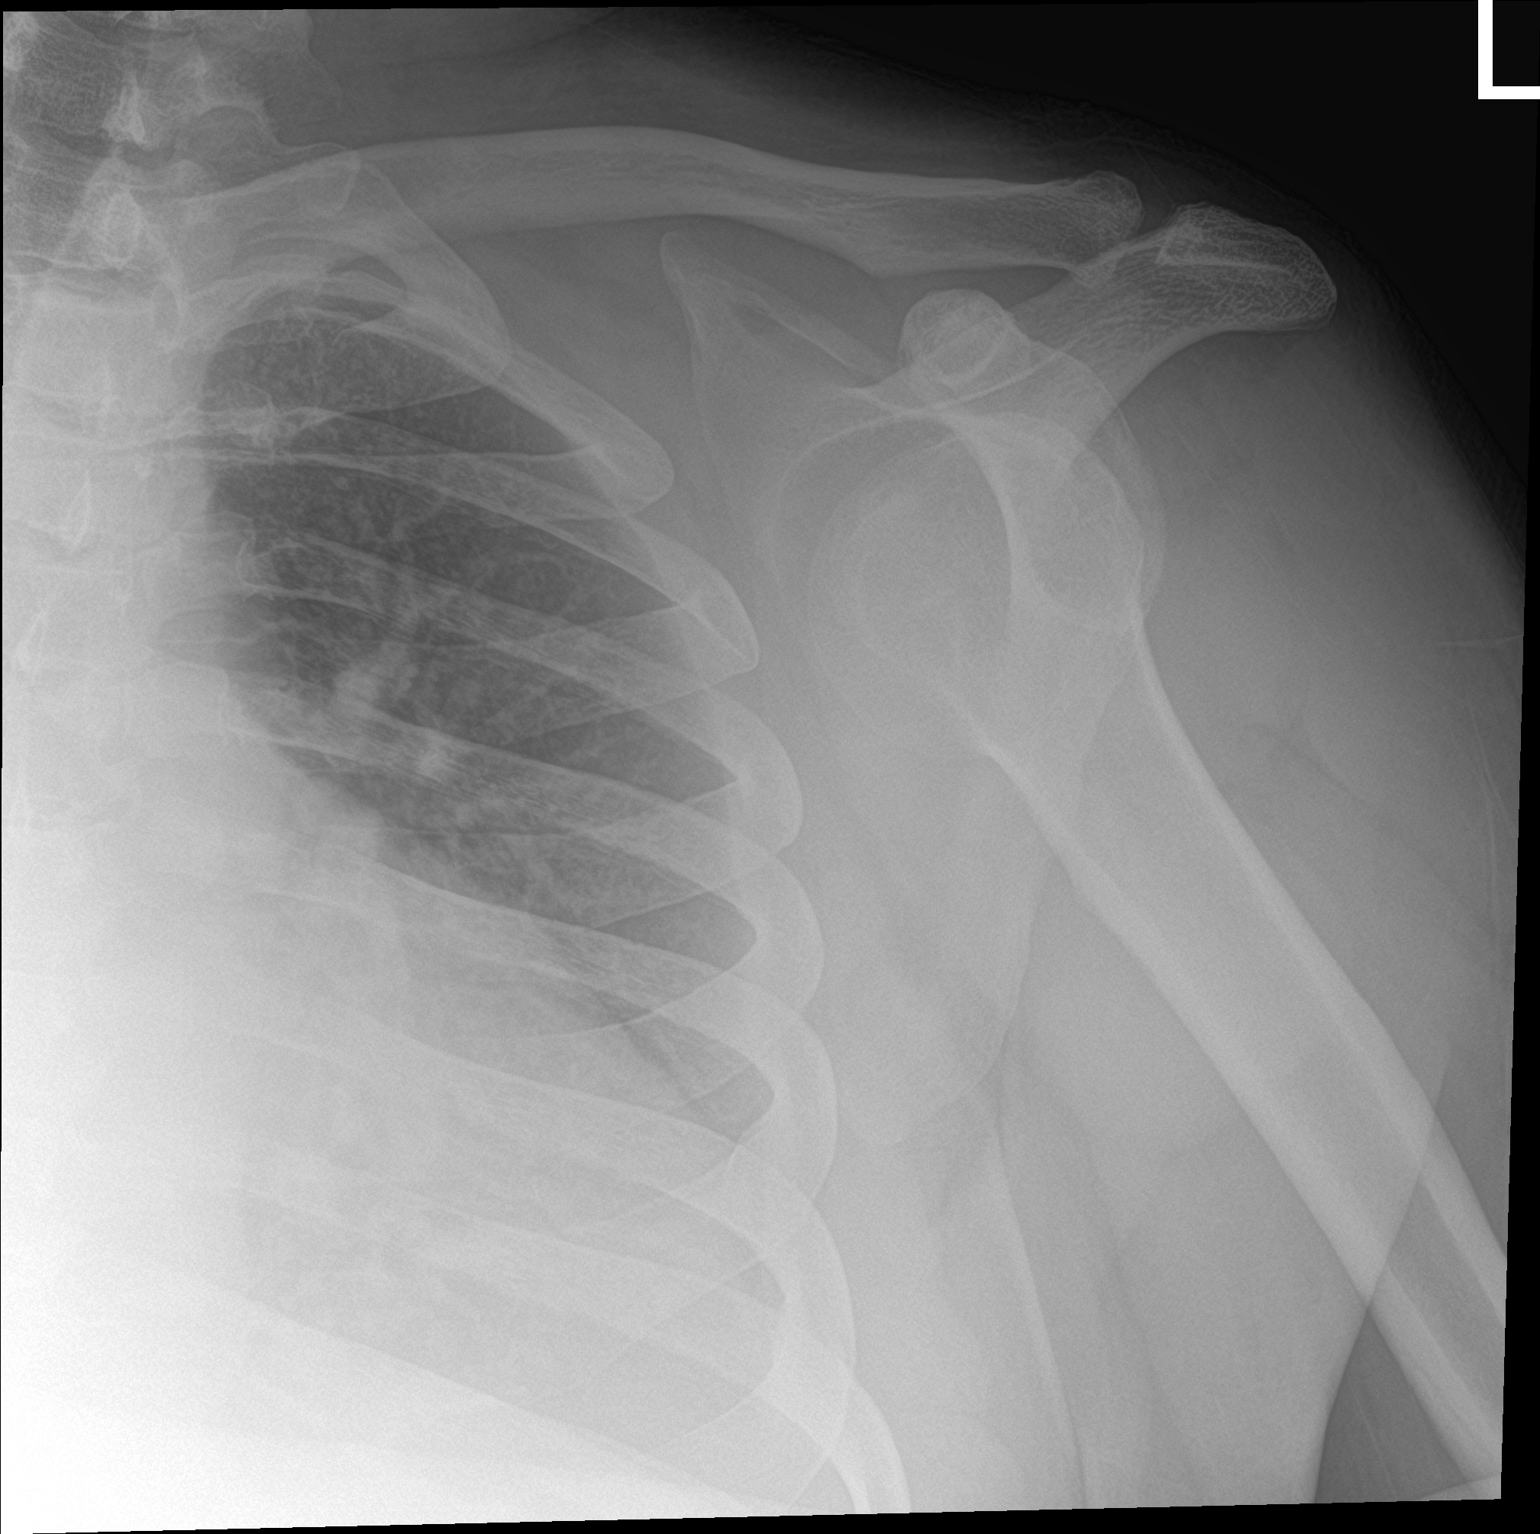

[2 of 2 positions shown; findings below may reference images not displayed]

FINDINGS: There is anterior dislocation of the left shoulder. No definite
acute fracture identified on the provided images.
IMPRESSION: Anterior shoulder dislocation.

## 2023-08-31 ENCOUNTER — Ambulatory Visit: Admitting: Podiatry

## 2023-08-31 DIAGNOSIS — M79675 Pain in left toe(s): Secondary | ICD-10-CM | POA: Diagnosis not present

## 2023-08-31 DIAGNOSIS — L89891 Pressure ulcer of other site, stage 1: Secondary | ICD-10-CM | POA: Diagnosis not present

## 2023-08-31 DIAGNOSIS — L602 Onychogryphosis: Secondary | ICD-10-CM | POA: Diagnosis not present

## 2023-08-31 DIAGNOSIS — M79674 Pain in right toe(s): Secondary | ICD-10-CM

## 2023-08-31 DIAGNOSIS — B351 Tinea unguium: Secondary | ICD-10-CM | POA: Diagnosis not present

## 2023-08-31 NOTE — Progress Notes (Unsigned)
 Subjective:  Patient ID: Alex Jones, male    DOB: 10-03-1960,  MRN: [id number]  Alex Jones presents to clinic today for:  Chief Complaint  Patient presents with   [facility]    RFC with out callous. Nails are really long and thick, some are curled over and under the toe. Not sure that he has true ingrowns, but some of his nails are very lose. Not diabetic and no anti coag.    Patient notes nails are thick, discolored, elongated and painful in shoegear when trying to ambulate.  States they are long and thick and very difficult to cut.  The right great toenail is quite sensitive, noting it is growing down and around the toe and cutting into the skin..  Past Medical History:  Diagnosis Date   Rotator cuff disorder, left    Past Surgical History:  Procedure Laterality Date   SHOULDER ARTHROSCOPY WITH BICEPS TENDON REPAIR Left 04/11/2017   Procedure: SHOULDER ARTHROSCOPY WITH BICEPS TENDON REPAIR;  Surgeon: Cristy Bonner DASEN, MD;  Location: [facility];  Service: Orthopedics;  Laterality: Left;   SHOULDER ARTHROSCOPY WITH ROTATOR CUFF REPAIR AND SUBACROMIAL DECOMPRESSION Left 04/11/2017   Procedure: LEFT SHOULDER ARTHROSCOPY WITH SUBACROMIAL DECOMPRESSION AND ROTATOR CUFF  BICEP TENDON REPAIR;  Surgeon: Cristy Bonner DASEN, MD;  Location: [facility];  Service: Orthopedics;  Laterality: Left;   TONSILLECTOMY     at age 84   Allergies  Allergen Reactions   Oxycodone  Nausea And Vomiting    Review of Systems: Negative except as noted in the HPI.  Objective:  Alex Jones is a pleasant 63 y.o. male in NAD. AAO x 3.  Vascular Examination: Capillary refill time is 3-5 seconds to toes bilateral. Palpable pedal pulses b/l LE. Digital hair present b/l.  Skin temperature gradient WNL b/l. No varicosities b/l. No cyanosis noted b/l.   Dermatological Examination: Pedal skin with normal turgor, texture and tone b/l. No open wounds. No interdigital macerations b/l. Toenails x10 are  4-6 mm thick, discolored, dystrophic with subungual debris. There is pain with compression of the nail plates.  They are moderate to severely elongated x10.  The right hallux nail is extremely long and growing downward and sideways, curling back on itself and the tip of the nail is cutting into the skin.  This is painful.  No signs of infection are noted.  Assessment/Plan: 1. Pain due to onychomycosis of toenails of both feet   2. Onychogryphosis   3. Pressure injury of right foot, stage 1    The mycotic toenails were sharply debrided x10 with sterile nail nippers and a power debriding burr to decrease bulk/thickness and length.    Antibiotic ointment and a Band-Aid were applied to the right great toe once the nail was debrided, revealing a cut to the skin where his nail was growing into the skin.  He can perform Epsom salt soaks once daily over the next 2 to 3 days and apply antibiotic ointment to the area, but this should then be resolved and no further treatment needed  Return in about 3 months (around 12/01/2023) for RFC.   Awanda CHARM Imperial, DPM, FACFAS [facility]     [address]                                        [place],  [place] [address]                Office [phone]  Fax [phone]

## 2023-12-02 ENCOUNTER — Ambulatory Visit: Admitting: Podiatry

## 2023-12-02 DIAGNOSIS — M79675 Pain in left toe(s): Secondary | ICD-10-CM | POA: Diagnosis not present

## 2023-12-02 DIAGNOSIS — M79674 Pain in right toe(s): Secondary | ICD-10-CM | POA: Diagnosis not present

## 2023-12-02 DIAGNOSIS — B351 Tinea unguium: Secondary | ICD-10-CM

## 2023-12-02 NOTE — Progress Notes (Signed)
    Subjective:  Patient ID: Alex Jones, male    DOB: 1960-08-21,  MRN: [id number]  Alex Jones presents to clinic today for:  Chief Complaint  Patient presents with   RFC    RFC no callous Not diabetic and no anti coag.    Patient notes nails are thick, discolored, elongated and painful in shoegear when trying to ambulate.   Past Medical History:  Diagnosis Date   Rotator cuff disorder, left    Past Surgical History:  Procedure Laterality Date   SHOULDER ARTHROSCOPY WITH BICEPS TENDON REPAIR Left 04/11/2017   Procedure: SHOULDER ARTHROSCOPY WITH BICEPS TENDON REPAIR;  Surgeon: Cristy Bonner DASEN, MD;  Location: [facility];  Service: Orthopedics;  Laterality: Left;   SHOULDER ARTHROSCOPY WITH ROTATOR CUFF REPAIR AND SUBACROMIAL DECOMPRESSION Left 04/11/2017   Procedure: LEFT SHOULDER ARTHROSCOPY WITH SUBACROMIAL DECOMPRESSION AND ROTATOR CUFF  BICEP TENDON REPAIR;  Surgeon: Cristy Bonner DASEN, MD;  Location: [facility];  Service: Orthopedics;  Laterality: Left;   TONSILLECTOMY     at age 22   Allergies  Allergen Reactions   Oxycodone  Nausea And Vomiting    Review of Systems: Negative except as noted in the HPI.  Objective:  Alex Jones is a pleasant 63 y.o. male in NAD. AAO x 3.  Vascular Examination: Capillary refill time is 3-5 seconds to toes bilateral. Palpable pedal pulses b/l LE. Digital hair present b/l.  Skin temperature gradient WNL b/l. No varicosities b/l. No cyanosis noted b/l.   Dermatological Examination: Pedal skin with normal turgor, texture and tone b/l. No open wounds. No interdigital macerations b/l. Toenails x10 are 4-6 mm thick, discolored, dystrophic with subungual debris. There is pain with compression of the nail plates.    Assessment/Plan: 1. Pain due to onychomycosis of toenails of both feet    The mycotic toenails were sharply debrided x10 with sterile nail nippers and a power debriding burr to decrease bulk/thickness and  length.     Return in about 3 months (around 03/03/2024) for fungal nail recheck.   Alex Jones, DPM, FACFAS [facility]     [address] [place], [place] [address]                Office [phone]  Fax [phone]

## 2024-03-02 ENCOUNTER — Ambulatory Visit: Admitting: Podiatry
